# Patient Record
Sex: Female | Born: 1980 | Race: White | Hispanic: Yes | Marital: Married | Smoking: Never smoker
Health system: Southern US, Community
[De-identification: ages and names within clinical notes are randomized; demographics above are authoritative.]

## PROBLEM LIST (undated history)

## (undated) DIAGNOSIS — F419 Anxiety disorder, unspecified: Secondary | ICD-10-CM

## (undated) DIAGNOSIS — M1990 Unspecified osteoarthritis, unspecified site: Secondary | ICD-10-CM

## (undated) DIAGNOSIS — E079 Disorder of thyroid, unspecified: Secondary | ICD-10-CM

---

## 2006-07-19 ENCOUNTER — Encounter (HOSPITAL_COMMUNITY): Admission: RE | Admit: 2006-07-19 | Discharge: 2006-08-18 | Payer: Self-pay | Admitting: Endocrinology

## 2013-09-24 ENCOUNTER — Emergency Department (HOSPITAL_COMMUNITY): Payer: Medicaid Other

## 2013-09-24 ENCOUNTER — Encounter (HOSPITAL_COMMUNITY): Payer: Self-pay | Admitting: Emergency Medicine

## 2013-09-24 ENCOUNTER — Inpatient Hospital Stay (HOSPITAL_COMMUNITY)
Admission: EM | Admit: 2013-09-24 | Discharge: 2013-09-25 | DRG: 176 | Disposition: A | Discharge status: Home / Self Care | Payer: Medicaid Other | Attending: Internal Medicine | Admitting: Internal Medicine

## 2013-09-24 DIAGNOSIS — F411 Generalized anxiety disorder: Secondary | ICD-10-CM | POA: Diagnosis present

## 2013-09-24 DIAGNOSIS — E039 Hypothyroidism, unspecified: Secondary | ICD-10-CM | POA: Diagnosis present

## 2013-09-24 DIAGNOSIS — I2699 Other pulmonary embolism without acute cor pulmonale: Secondary | ICD-10-CM | POA: Diagnosis not present

## 2013-09-24 DIAGNOSIS — M129 Arthropathy, unspecified: Secondary | ICD-10-CM | POA: Diagnosis present

## 2013-09-24 DIAGNOSIS — R109 Unspecified abdominal pain: Secondary | ICD-10-CM | POA: Diagnosis present

## 2013-09-24 HISTORY — DX: Disorder of thyroid, unspecified: E07.9

## 2013-09-24 HISTORY — DX: Anxiety disorder, unspecified: F41.9

## 2013-09-24 HISTORY — DX: Unspecified osteoarthritis, unspecified site: M19.90

## 2013-09-24 LAB — CBC WITH DIFFERENTIAL/PLATELET
BASOS ABS: 0 10*3/uL (ref 0.0–0.1)
BASOS PCT: 0 % (ref 0–1)
EOS ABS: 0.3 10*3/uL (ref 0.0–0.7)
Eosinophils Relative: 3 % (ref 0–5)
HCT: 37.9 % (ref 36.0–46.0)
Hemoglobin: 13.5 g/dL (ref 12.0–15.0)
LYMPHS ABS: 2.1 10*3/uL (ref 0.7–4.0)
Lymphocytes Relative: 18 % (ref 12–46)
MCH: 30.5 pg (ref 26.0–34.0)
MCHC: 35.6 g/dL (ref 30.0–36.0)
MCV: 85.6 fL (ref 78.0–100.0)
Monocytes Absolute: 0.7 10*3/uL (ref 0.1–1.0)
Monocytes Relative: 6 % (ref 3–12)
NEUTROS PCT: 73 % (ref 43–77)
Neutro Abs: 8.7 10*3/uL — ABNORMAL HIGH (ref 1.7–7.7)
PLATELETS: 291 10*3/uL (ref 150–400)
RBC: 4.43 MIL/uL (ref 3.87–5.11)
RDW: 12.9 % (ref 11.5–15.5)
WBC: 11.8 10*3/uL — ABNORMAL HIGH (ref 4.0–10.5)

## 2013-09-24 LAB — URINALYSIS, ROUTINE W REFLEX MICROSCOPIC
Bilirubin Urine: NEGATIVE
GLUCOSE, UA: NEGATIVE mg/dL
Ketones, ur: NEGATIVE mg/dL
Leukocytes, UA: NEGATIVE
Nitrite: NEGATIVE
Protein, ur: NEGATIVE mg/dL
SPECIFIC GRAVITY, URINE: 1.015 (ref 1.005–1.030)
UROBILINOGEN UA: 0.2 mg/dL (ref 0.0–1.0)
pH: 6.5 (ref 5.0–8.0)

## 2013-09-24 LAB — BASIC METABOLIC PANEL
ANION GAP: 13 (ref 5–15)
BUN: 9 mg/dL (ref 6–23)
CO2: 25 mEq/L (ref 19–32)
Calcium: 9.1 mg/dL (ref 8.4–10.5)
Chloride: 99 mEq/L (ref 96–112)
Creatinine, Ser: 0.64 mg/dL (ref 0.50–1.10)
Glucose, Bld: 88 mg/dL (ref 70–99)
POTASSIUM: 4.4 meq/L (ref 3.7–5.3)
SODIUM: 137 meq/L (ref 137–147)

## 2013-09-24 LAB — D-DIMER, QUANTITATIVE (NOT AT ARMC): D DIMER QUANT: 1.83 ug{FEU}/mL — AB (ref 0.00–0.48)

## 2013-09-24 LAB — PROTIME-INR
INR: 1.04 (ref 0.00–1.49)
Prothrombin Time: 13.6 seconds (ref 11.6–15.2)

## 2013-09-24 LAB — PREGNANCY, URINE: PREG TEST UR: NEGATIVE

## 2013-09-24 LAB — URINE MICROSCOPIC-ADD ON

## 2013-09-24 MED ORDER — ONDANSETRON HCL 4 MG/2ML IJ SOLN: 4.0000 mg | Freq: Three times a day (TID) | INTRAMUSCULAR | Status: AC | PRN

## 2013-09-24 MED ORDER — OXYCODONE-ACETAMINOPHEN 5-325 MG PO TABS
1.0000 | ORAL_TABLET | Freq: Once | ORAL | Status: AC
  Administered 2013-09-24: 1 via ORAL
  Filled 2013-09-24: qty 1

## 2013-09-24 MED ORDER — ACETAMINOPHEN 650 MG RE SUPP: 650.0000 mg | Freq: Four times a day (QID) | RECTAL | Status: DC | PRN

## 2013-09-24 MED ORDER — ONDANSETRON HCL 4 MG PO TABS: 4.0000 mg | ORAL_TABLET | Freq: Four times a day (QID) | ORAL | Status: DC | PRN

## 2013-09-24 MED ORDER — SODIUM CHLORIDE 0.9 % IJ SOLN
3.0000 mL | Freq: Two times a day (BID) | INTRAMUSCULAR | Status: DC
  Administered 2013-09-24: 3 mL via INTRAVENOUS

## 2013-09-24 MED ORDER — IOHEXOL 350 MG/ML SOLN
100.0000 mL | Freq: Once | INTRAVENOUS | Status: AC | PRN
  Administered 2013-09-24: 100 mL via INTRAVENOUS

## 2013-09-24 MED ORDER — ALUM & MAG HYDROXIDE-SIMETH 200-200-20 MG/5ML PO SUSP: 30.0000 mL | Freq: Four times a day (QID) | ORAL | Status: DC | PRN

## 2013-09-24 MED ORDER — LEVOTHYROXINE SODIUM 75 MCG PO TABS
150.0000 ug | ORAL_TABLET | Freq: Every day | ORAL | Status: DC
Start: 2013-09-25 — End: 2013-09-25
  Administered 2013-09-25: 150 ug via ORAL
  Filled 2013-09-24: qty 2

## 2013-09-24 MED ORDER — OXYCODONE HCL 5 MG PO TABS
5.0000 mg | ORAL_TABLET | ORAL | Status: DC | PRN
  Administered 2013-09-24: 5 mg via ORAL
  Filled 2013-09-24: qty 1

## 2013-09-24 MED ORDER — MORPHINE SULFATE 2 MG/ML IJ SOLN: 2.0000 mg | INTRAMUSCULAR | Status: DC | PRN

## 2013-09-24 MED ORDER — ACETAMINOPHEN 325 MG PO TABS: 650.0000 mg | ORAL_TABLET | Freq: Four times a day (QID) | ORAL | Status: DC | PRN

## 2013-09-24 MED ORDER — ONDANSETRON HCL 4 MG/2ML IJ SOLN: 4.0000 mg | Freq: Four times a day (QID) | INTRAMUSCULAR | Status: DC | PRN

## 2013-09-24 MED ORDER — ENOXAPARIN SODIUM 80 MG/0.8ML ~~LOC~~ SOLN
70.0000 mg | Freq: Two times a day (BID) | SUBCUTANEOUS | Status: DC
  Administered 2013-09-25: 70 mg via SUBCUTANEOUS
  Filled 2013-09-24: qty 0.8

## 2013-09-24 MED ORDER — ESCITALOPRAM OXALATE 10 MG PO TABS
10.0000 mg | ORAL_TABLET | Freq: Every day | ORAL | Status: DC
  Administered 2013-09-25: 10 mg via ORAL
  Filled 2013-09-24: qty 1

## 2013-09-24 MED ORDER — ENOXAPARIN SODIUM 80 MG/0.8ML ~~LOC~~ SOLN
70.0000 mg | Freq: Once | SUBCUTANEOUS | Status: AC
  Administered 2013-09-24: 70 mg via SUBCUTANEOUS
  Filled 2013-09-24: qty 0.8

## 2013-09-24 NOTE — ED Notes (Signed)
Back pain- cramping. Worse with inspiration and movement per pt.

## 2013-09-24 NOTE — ED Provider Notes (Signed)
CSN: [id number]     Arrival date & time 09/24/13  1111 History   First MD Initiated Contact with Patient 09/24/13 1133     Chief Complaint  Patient presents with  . Back Pain     (Consider location/radiation/quality/duration/timing/severity/associated sxs/prior Treatment) The history is provided by the patient and a relative.   Adrienne Dennis is a 33 y.o. female who is normally a very active, healthy person who developed rather sudden onset of left mid back and flank pain when sitting on her couch yesterday evening.  She reports pain is worsened with deep inspiration and with movement.  She denies fever or chills, cough, chest pain, nausea, vomiting, dysuria and does not have a history of prior back pain or kidney stones. She also denies recent long periods of inactivity and denies lower extremity pain, swelling or trauma.  There is no radiation of pain which has been constant.  She has taken no medications prior to arrival.      Past Medical History  Diagnosis Date  . Thyroid disease   . Anxiety   . Arthritis    History reviewed. No pertinent past surgical history. History reviewed. No pertinent family history. History  Substance Use Topics  . Smoking status: Never Smoker   . Smokeless tobacco: Not on file  . Alcohol Use: No   OB History   Grav Para Term Preterm Abortions TAB SAB Ect Mult Living                 Review of Systems  Constitutional: Negative for fever and chills.  HENT: Negative for congestion and sore throat.   Eyes: Negative.   Respiratory: Negative for cough, chest tightness, shortness of breath, wheezing and stridor.   Cardiovascular: Negative for chest pain.  Gastrointestinal: Negative for nausea, vomiting and abdominal pain.  Genitourinary: Positive for flank pain. Negative for dysuria and hematuria.  Musculoskeletal: Negative for arthralgias, joint swelling and neck pain.  Skin: Negative.  Negative for rash and wound.  Neurological: Negative for  dizziness, weakness, light-headedness, numbness and headaches.  Psychiatric/Behavioral: Negative.       Allergies  Review of patient's allergies indicates no known allergies.  Home Medications   Prior to Admission medications   Medication Sig Start Date End Date Taking? Authorizing Provider  desogestrel-ethinyl estradiol (VELIVET,CAZIANT,CESIA,CYCLESSA) 0.1/0.125/0.15 -0.025 MG tablet Take 1 tablet by mouth daily.   Yes Historical Provider, MD  escitalopram (LEXAPRO) 10 MG tablet Take 10 mg by mouth daily.   Yes Historical Provider, MD  levothyroxine (SYNTHROID, LEVOTHROID) 150 MCG tablet Take 150 mcg by mouth daily before breakfast.   Yes Historical Provider, MD   BP 113/81  Pulse 106  Temp(Src) 99 F (37.2 C) (Oral)  Resp 20  Ht 5' (1.524 m)  Wt 150 lb (68.04 kg)  BMI 29.30 kg/m2  SpO2 97%  LMP 09/14/2013 Physical Exam  Nursing note and vitals reviewed. Constitutional: She appears well-developed and well-nourished.  HENT:  Head: Normocephalic and atraumatic.  Eyes: Conjunctivae are normal.  Neck: Normal range of motion.  Cardiovascular: Normal rate, regular rhythm, normal heart sounds and intact distal pulses.   Pulmonary/Chest: Effort normal and breath sounds normal. No respiratory distress. She has no wheezes. She exhibits no tenderness.  Unable to reproduce flank pain.  No cva tenderness.  Borderline tachy.  Abdominal: Soft. Bowel sounds are normal. There is no tenderness.  Musculoskeletal: Normal range of motion. She exhibits no edema and no tenderness.  No leg, calf or ankle tenderness.  Neurological: She is alert.  Skin: Skin is warm and dry.  Psychiatric: She has a normal mood and affect.    ED Course  Procedures (including critical care time) Labs Review Labs Reviewed  URINALYSIS, ROUTINE W REFLEX MICROSCOPIC - Abnormal; Notable for the following:    Hgb urine dipstick LARGE (*)    All other components within normal limits  D-DIMER, QUANTITATIVE -  Abnormal; Notable for the following:    D-Dimer, Quant 1.83 (*)    All other components within normal limits  CBC WITH DIFFERENTIAL - Abnormal; Notable for the following:    WBC 11.8 (*)    Neutro Abs 8.7 (*)    All other components within normal limits  PREGNANCY, URINE  URINE MICROSCOPIC-ADD ON  BASIC METABOLIC PANEL  PROTIME-INR  TSH    Imaging Review Dg Chest 2 View  09/24/2013   CLINICAL DATA:  Pleuritic flank pain.  EXAM: CHEST  2 VIEW  COMPARISON:  None.  FINDINGS: There are low lung volumes with patchy bibasilar airspace opacities. No hemidiaphragm obscuration or significant pleural effusion is seen. The heart size and mediastinal contours are normal.  IMPRESSION: Patchy bibasilar airspace opacities, most likely reflecting atelectasis or pneumonia. Correlate clinically.   Electronically Signed   By: Roxy Horseman M.D.   On: 09/24/2013 12:49   Ct Angio Chest Pe W/cm &/or Wo Cm  09/24/2013   CLINICAL DATA:  Chest pain. Shortness of breath. History of anxiety.  EXAM: CT ANGIOGRAPHY CHEST WITH CONTRAST  TECHNIQUE: Multidetector CT imaging of the chest was performed using the standard protocol during bolus administration of intravenous contrast. Multiplanar CT image reconstructions and MIPs were obtained to evaluate the vascular anatomy.  CONTRAST:  OMNIPAQUE IOHEXOL 350 MG/ML SOLN  COMPARISON:  Chest radiograph of earlier today.  No prior CT.  FINDINGS: Lungs/Pleura:  Minimal motion degradation.  Bibasilar subsegmental atelectasis, greater on the left.  Trace left pleural fluid.  Heart/Mediastinum: The quality of this examination for evaluation of pulmonary embolism is moderate. The bolus is minimally suboptimally timed, with contrast centered in the SVC. Relatively low volume but definite pulmonary emboli to segmental to subsegmental left lower lobe. Example image 109/series 10 and coronal image 73 of series 602. No evidence of right heart strain.  Normal aortic caliber without  dissection. Mild cardiomegaly. No mediastinal or hilar adenopathy.  Upper Abdomen: Normal imaged portions of the liver, spleen, stomach.  Bones/Musculoskeletal:  No acute osseous abnormality.  Review of the MIP images confirms the above findings.  IMPRESSION: 1. Small volume but definite left lower lobe pulmonary emboli. No evidence of right heart strain. 2. Small left pleural effusion with left greater than right bibasilar atelectasis. 3. Mild cardiomegaly. These results were called by telephone at the time of interpretation on 09/24/2013 at 2:53 pm to Ginger Pruitt, r.n. who verbally acknowledged these results.   Electronically Signed   By: Jeronimo Greaves M.D.   On: 09/24/2013 14:54     EKG Interpretation None      MDM   Final diagnoses:  Pulmonary embolism    Patients labs and/or radiological studies were viewed and considered during the medical decision making and disposition process. Pt with Pulmonary embolism.  Discussed with Dr. Vanessa Barbara with [facility].  Will admit.  Temp admission orders placed.    Burgess Amor, PA-C 09/24/13 1724  Burgess Amor, PA-C 09/24/13 1724

## 2013-09-24 NOTE — Progress Notes (Signed)
ANTICOAGULATION CONSULT NOTE - Initial Consult  Pharmacy Consult for Lovenox Indication: pulmonary embolus  No Known Allergies  Patient Measurements: Height: 5' (152.4 cm) Weight: 150 lb (68.04 kg) IBW/kg (Calculated) : 45.5 Heparin Dosing Weight: 68 kg  Vital Signs: Temp: 99 F (37.2 C) (08/12 1617) Temp src: Oral (08/12 1617) BP: 113/81 mmHg (08/12 1617) Pulse Rate: 106 (08/12 1617)  Labs:  Recent Labs  09/24/13 1305  HGB 13.5  HCT 37.9  PLT 291  CREATININE 0.64    Estimated Creatinine Clearance: 85.3 ml/min (by C-G formula based on Cr of 0.64).   Medical History: Past Medical History  Diagnosis Date  . Thyroid disease   . Anxiety   . Arthritis     Medications:  Scheduled:  . enoxaparin (LOVENOX) injection  70 mg Subcutaneous Once  . [START ON 09/25/2013] enoxaparin (LOVENOX) injection  70 mg Subcutaneous Q12H  . escitalopram  10 mg Oral Daily  . [START ON 09/25/2013] levothyroxine  150 mcg Oral QAC breakfast  . sodium chloride  3 mL Intravenous Q12H    Assessment: 33 y.o. Female, presenting with complaints of left-sided back pain, some shortness of breath Small left lower lobe pulmonary emboli  Oral contraceptive use may be only risk factor CrCl 85.3 ml/min   Goal of Therapy:  Anti-Xa level 0.6-1 units/ml 4hrs after LMWH dose given Monitor platelets by anticoagulation protocol: Yes   Plan:  Lovenox 70 mg (1 mg/kg) SQ every 12 hours Monitor CBC/platelets Monitor renal function Labs per protocol  Raquel JamesPittman, Kaden Daughdrill Bennett 09/24/2013,4:28 PM

## 2013-09-24 NOTE — H&P (Signed)
[facility] History and Physical  Siona Coulston ZOX:[id number] DOB: 1980/03/29 DOA: 09/24/2013  Referring physician:  PCP: No PCP Per Patient   Chief Complaint: Back pain  HPI: Adrienne Dennis is a 33 y.o. female with a past medical history of hypothyroidism, anxiety, presenting to the emergency room with complaints of left-sided back pain. She states that symptoms started approximately at 11:30 yesterday evening occurring at home. She noticed that her pain was precipitated by deep inspiration and cough, associated with some shortness of breath. She denies precordial chest pain, fevers, chills, palpitations, dizziness, lightheadedness, syncope, presyncope. In the emergency room she was worked up with a CT scan of lungs with contrast which identified a small left lower lobe pulmonary emboli. There is no evidence of right heart strain per radiology. She was also found to have small pleural effusions with left greater than right bibasilar atelectasis. She denies recent travel, tobacco abuse, or prolonged periods of immobility.                                                                               Review of Systems:  Constitutional:  No weight loss, night sweats, Fevers, chills, fatigue.  HEENT:  No headaches, Difficulty swallowing,Tooth/dental problems,Sore throat,  No sneezing, itching, ear ache, nasal congestion, post nasal drip,  Cardio-vascular:  No chest pain, Orthopnea, PND, swelling in lower extremities, anasarca, dizziness, palpitations  GI:  No heartburn, indigestion, abdominal pain, nausea, vomiting, diarrhea, change in bowel habits, loss of appetite  Resp:  No shortness of breath with exertion or at rest. No excess mucus, no productive cough, No non-productive cough, No coughing up of blood.No change in color of mucus.No wheezing.No chest wall deformity  Skin:  no rash or lesions.  GU:  no dysuria, change in color of urine, no urgency or frequency. No flank pain.    Musculoskeletal:  No joint pain or swelling. No decreased range of motion. Positive for back pain with deep inspiration.  Psych:  No change in mood or affect. No depression or anxiety. No memory loss.   Past Medical History  Diagnosis Date  . Thyroid disease   . Anxiety   . Arthritis    History reviewed. No pertinent past surgical history. Social History:  has no tobacco, alcohol, and drug history on file.  No Known Allergies  No family history on file.   Prior to Admission medications   Medication Sig Start Date End Date Taking? Authorizing Provider  desogestrel-ethinyl estradiol (VELIVET,CAZIANT,CESIA,CYCLESSA) 0.1/0.125/0.15 -0.025 MG tablet Take 1 tablet by mouth daily.   Yes Historical Provider, MD  escitalopram (LEXAPRO) 10 MG tablet Take 10 mg by mouth daily.   Yes Historical Provider, MD  levothyroxine (SYNTHROID, LEVOTHROID) 150 MCG tablet Take 150 mcg by mouth daily before breakfast.   Yes Historical Provider, MD   Physical Exam: Filed Vitals:   09/24/13 1124 09/24/13 1357  BP: 129/79 123/73  Pulse: 102 102  Temp: 98.3 F (36.8 C)   TempSrc: Oral   Resp: 17 20  Weight: 68.04 kg (150 lb)   SpO2: 100% 96%    Wt Readings from Last 3 Encounters:  09/24/13 68.04 kg (150 lb)    General:  Appears calm and  comfortable, no acute distress Eyes: PERRL, normal lids, irises & conjunctiva ENT: grossly normal hearing, lips & tongue Neck: no LAD, masses or thyromegaly Cardiovascular: RRR, no m/r/g. No LE edema. Telemetry: SR, no arrhythmias  Respiratory: CTA bilaterally, no w/r/r. Normal respiratory effort. Abdomen: soft, ntnd Skin: no rash or induration seen on limited exam Musculoskeletal: grossly normal tone BUE/BLE Psychiatric: grossly normal mood and affect, speech fluent and appropriate Neurologic: grossly non-focal.          Labs on Admission:  Basic Metabolic Panel:  Recent Labs Lab 09/24/13 1305  NA 137  K 4.4  CL 99  CO2 25  GLUCOSE 88  BUN 9   CREATININE 0.64  CALCIUM 9.1   Liver Function Tests: No results found for this basename: AST, ALT, ALKPHOS, BILITOT, PROT, ALBUMIN,  in the last 168 hours No results found for this basename: LIPASE, AMYLASE,  in the last 168 hours No results found for this basename: AMMONIA,  in the last 168 hours CBC:  Recent Labs Lab 09/24/13 1305  WBC 11.8*  NEUTROABS 8.7*  HGB 13.5  HCT 37.9  MCV 85.6  PLT 291   Cardiac Enzymes: No results found for this basename: CKTOTAL, CKMB, CKMBINDEX, TROPONINI,  in the last 168 hours  BNP (last 3 results) No results found for this basename: PROBNP,  in the last 8760 hours CBG: No results found for this basename: GLUCAP,  in the last 168 hours  Radiological Exams on Admission: Dg Chest 2 View  09/24/2013   CLINICAL DATA:  Pleuritic flank pain.  EXAM: CHEST  2 VIEW  COMPARISON:  None.  FINDINGS: There are low lung volumes with patchy bibasilar airspace opacities. No hemidiaphragm obscuration or significant pleural effusion is seen. The heart size and mediastinal contours are normal.  IMPRESSION: Patchy bibasilar airspace opacities, most likely reflecting atelectasis or pneumonia. Correlate clinically.   Electronically Signed   By: Roxy Horseman M.D.   On: 09/24/2013 12:49   Ct Angio Chest Pe W/cm &/or Wo Cm  09/24/2013   CLINICAL DATA:  Chest pain. Shortness of breath. History of anxiety.  EXAM: CT ANGIOGRAPHY CHEST WITH CONTRAST  TECHNIQUE: Multidetector CT imaging of the chest was performed using the standard protocol during bolus administration of intravenous contrast. Multiplanar CT image reconstructions and MIPs were obtained to evaluate the vascular anatomy.  CONTRAST:  OMNIPAQUE IOHEXOL 350 MG/ML SOLN  COMPARISON:  Chest radiograph of earlier today.  No prior CT.  FINDINGS: Lungs/Pleura:  Minimal motion degradation.  Bibasilar subsegmental atelectasis, greater on the left.  Trace left pleural fluid.  Heart/Mediastinum: The quality of this  examination for evaluation of pulmonary embolism is moderate. The bolus is minimally suboptimally timed, with contrast centered in the SVC. Relatively low volume but definite pulmonary emboli to segmental to subsegmental left lower lobe. Example image 109/series 10 and coronal image 73 of series 602. No evidence of right heart strain.  Normal aortic caliber without dissection. Mild cardiomegaly. No mediastinal or hilar adenopathy.  Upper Abdomen: Normal imaged portions of the liver, spleen, stomach.  Bones/Musculoskeletal:  No acute osseous abnormality.  Review of the MIP images confirms the above findings.  IMPRESSION: 1. Small volume but definite left lower lobe pulmonary emboli. No evidence of right heart strain. 2. Small left pleural effusion with left greater than right bibasilar atelectasis. 3. Mild cardiomegaly. These results were called by telephone at the time of interpretation on 09/24/2013 at 2:53 pm to Ginger Pruitt, r.n. who verbally acknowledged these results.  Electronically Signed   By: Jeronimo GreavesKyle  Talbot M.D.   On: 09/24/2013 14:54    EKG:   Assessment/Plan Principal Problem:   Pulmonary embolism Active Problems:   Hypothyroid   PE (pulmonary embolism)   1. Pulmonary embolism. Patient presenting with complaints of left-sided back pain precipitated by deep inspiration. On CT scan of lungs with IV contrast was found to have a small left-sided pulmonary embolus. She denies history of prolonged immobility or recent travel, does report oral contraceptive use which may be her only risk factor. Will place her in overnight observation on telemetry, start anticoagulation with Lovenox 1 mg per kilogram body weight, pharmacy consultation for dosing. Anticipate discharge in a.m. if she remains stable. Consult social work for prescription assistance.  2. Hypothyroidism. Will check a TSH, meanwhile continue her home regimen of Synthroid 150 mg by mouth daily 3. Nutrition. Regular diet 4. DVT  prophylaxis. Patient fully anticoagulated    Code Status: Full code Family Communication:  Disposition Plan: Will place in 24-hour observation, do not anticipate requiring greater than 2 nights hospitalization  Time spent: 55 min  Jeralyn BennettZAMORA, Oneita Allmon [facility] Pager [phone]  **Disclaimer: This note may have been dictated with voice recognition software. Similar sounding words can inadvertently be transcribed and this note may contain transcription errors which may not have been corrected upon publication of note.**

## 2013-09-25 LAB — BASIC METABOLIC PANEL
Anion gap: 12 (ref 5–15)
BUN: 6 mg/dL (ref 6–23)
CHLORIDE: 101 meq/L (ref 96–112)
CO2: 26 meq/L (ref 19–32)
CREATININE: 0.66 mg/dL (ref 0.50–1.10)
Calcium: 9 mg/dL (ref 8.4–10.5)
GFR calc Af Amer: >90 mL/min (ref >90)
GFR calc non Af Amer: >90 mL/min (ref >90)
GLUCOSE: 106 mg/dL — AB (ref 70–99)
Potassium: 3.6 mEq/L — ABNORMAL LOW (ref 3.7–5.3)
Sodium: 139 mEq/L (ref 137–147)

## 2013-09-25 LAB — CBC
HCT: 38.2 % (ref 36.0–46.0)
HEMOGLOBIN: 13.3 g/dL (ref 12.0–15.0)
MCH: 30.4 pg (ref 26.0–34.0)
MCHC: 34.8 g/dL (ref 30.0–36.0)
MCV: 87.2 fL (ref 78.0–100.0)
Platelets: 279 10*3/uL (ref 150–400)
RBC: 4.38 MIL/uL (ref 3.87–5.11)
RDW: 13 % (ref 11.5–15.5)
WBC: 8.2 10*3/uL (ref 4.0–10.5)

## 2013-09-25 LAB — TSH: TSH: 0.214 u[IU]/mL — ABNORMAL LOW (ref 0.350–4.500)

## 2013-09-25 MED ORDER — RIVAROXABAN 20 MG PO TABS: 20.0000 mg | ORAL_TABLET | Freq: Every day | ORAL | Status: DC

## 2013-09-25 MED ORDER — RIVAROXABAN 15 MG PO TABS
15.0000 mg | ORAL_TABLET | Freq: Two times a day (BID) | ORAL | Status: DC
  Administered 2013-09-25: 15 mg via ORAL
  Filled 2013-09-25: qty 1

## 2013-09-25 MED ORDER — RIVAROXABAN 15 MG PO TABS: ORAL_TABLET | ORAL | Status: AC

## 2013-09-25 MED ORDER — RIVAROXABAN 15 MG PO TABS: 15.0000 mg | ORAL_TABLET | Freq: Two times a day (BID) | ORAL | Status: DC

## 2013-09-25 NOTE — Discharge Summary (Addendum)
Physician Discharge Summary  Adrienne Dennis Robak WUJ:[id number] DOB: 11-14-1980 DOA: 09/24/2013  PCP: No PCP Per Patient  Admit date: 09/24/2013 Discharge date: 09/25/2013  Time spent: 35 minutes  Recommendations for Outpatient Follow-up:  1. Patient discharged on Xarelto therapy for newly diagnosed PE 2. Please followup on repeat TSH and 4-6 weeks  Discharge Diagnoses:  Principal Problem:   Pulmonary embolism Active Problems:   Hypothyroid   PE (pulmonary embolism)   Discharge Condition: Stable/improved  Diet recommendation: Regular diet  Filed Weights   09/24/13 1124 09/24/13 1617  Weight: 68.04 kg (150 lb) 68.04 kg (150 lb)    History of present illness:  Adrienne Dennis Bilyeu is a 33 y.o. female with a past medical history of hypothyroidism, anxiety, presenting to the emergency room with complaints of left-sided back pain. She states that symptoms started approximately at 11:30 yesterday evening occurring at home. She noticed that her pain was precipitated by deep inspiration and cough, associated with some shortness of breath. She denies precordial chest pain, fevers, chills, palpitations, dizziness, lightheadedness, syncope, presyncope. In the emergency room she was worked up with a CT scan of lungs with contrast which identified a small left lower lobe pulmonary emboli. There is no evidence of right heart strain per radiology. She was also found to have small pleural effusions with left greater than right bibasilar atelectasis. She denies recent travel, tobacco abuse, or prolonged periods of immobility.    Hospital Course:  Patient is a pleasant 33 year old female with a past medical history of hypothyroidism, presented to the emergency room on 09/24/2013 with complaints of left-sided back pain precipitated by deep inspiration. Symptoms have started evening prior to presentation. Initial workup included a CT scan of lungs without contrast which identified a small left lower lobe pulmonary  embolus. She was started on anticoagulation with subcutaneous Lovenox. Patient was admitted to telemetry where she was observed overnight. She remained hemodynamically stable. I suspect that's oral contraceptive pills may have precipitated the development of DVT. She denied history of recent travel or prolonged immobilization. On the following morning she reported feeling well with improvement to back pain. Other issues addressed during this hospitalization include hypothyroidism as a TSH was checked, back depressed at 0.214. Patient had reported having a decrease in her Synthroid dose from 175 mcg to 150 mcg by mouth daily by her primary care physician. Social work was consulted for prescription assistance. Given clinical stability she was discharged in stable condition on 09/25/2013 to her home.   Discharge Exam: Filed Vitals:   09/25/13 0534  BP: 110/71  Pulse: 94  Temp: 98.4 F (36.9 C)  Resp: 18    General: Patient is in no acute distress she is awake and alert oriented x3 Cardiovascular: Regular rate rhythm normal S1-S2 no murmurs rubs or gallop Respiratory: Regular rate rhythm normal S1-S2 no murmurs rubs gallops Abdomen: Soft nontender nondistended positive bowel sounds  Discharge Instructions You were cared for by a hospitalist during your hospital stay. If you have any questions about your discharge medications or the care you received while you were in the hospital after you are discharged, you can call the unit and asked to speak with the hospitalist on call if the hospitalist that took care of you is not available. Once you are discharged, your primary care physician will handle any further medical issues. Please note that NO REFILLS for any discharge medications will be authorized once you are discharged, as it is imperative that you return to your primary care physician (or  establish a relationship with a primary care physician if you do not have one) for your aftercare needs so  that they can reassess your need for medications and monitor your lab values.      Discharge Instructions   Call MD for:  difficulty breathing, headache or visual disturbances    Complete by:  As directed      Call MD for:  extreme fatigue    Complete by:  As directed      Call MD for:  hives    Complete by:  As directed      Call MD for:  persistant dizziness or light-headedness    Complete by:  As directed      Call MD for:  redness, tenderness, or signs of infection (pain, swelling, redness, odor or green/yellow discharge around incision site)    Complete by:  As directed      Call MD for:  severe uncontrolled pain    Complete by:  As directed      Call MD for:  temperature >100.4    Complete by:  As directed      Diet - low sodium heart healthy    Complete by:  As directed      Increase activity slowly    Complete by:  As directed             Medication List    STOP taking these medications       desogestrel-ethinyl estradiol 0.1/0.125/0.15 -0.025 MG tablet  Commonly known as:  VELIVET,CAZIANT,CESIA,CYCLESSA      TAKE these medications       escitalopram 10 MG tablet  Commonly known as:  LEXAPRO  Take 10 mg by mouth daily.     levothyroxine 150 MCG tablet  Commonly known as:  SYNTHROID, LEVOTHROID  Take 150 mcg by mouth daily before breakfast.     Rivaroxaban 15 MG Tabs tablet  Commonly known as:  XARELTO  Take 15 mg PO BID for 3 weeks, then 20 mg PO q daily thereafter       No Known Allergies Follow-up Information   Follow up with No PCP Per Patient.   Specialty:  General Practice       The results of significant diagnostics from this hospitalization (including imaging, microbiology, ancillary and laboratory) are listed below for reference.    Significant Diagnostic Studies: Dg Chest 2 View  09/24/2013   CLINICAL DATA:  Pleuritic flank pain.  EXAM: CHEST  2 VIEW  COMPARISON:  None.  FINDINGS: There are low lung volumes with patchy bibasilar airspace  opacities. No hemidiaphragm obscuration or significant pleural effusion is seen. The heart size and mediastinal contours are normal.  IMPRESSION: Patchy bibasilar airspace opacities, most likely reflecting atelectasis or pneumonia. Correlate clinically.   Electronically Signed   By: Roxy Horseman M.D.   On: 09/24/2013 12:49   Ct Angio Chest Pe W/cm &/or Wo Cm  09/24/2013   CLINICAL DATA:  Chest pain. Shortness of breath. History of anxiety.  EXAM: CT ANGIOGRAPHY CHEST WITH CONTRAST  TECHNIQUE: Multidetector CT imaging of the chest was performed using the standard protocol during bolus administration of intravenous contrast. Multiplanar CT image reconstructions and MIPs were obtained to evaluate the vascular anatomy.  CONTRAST:  OMNIPAQUE IOHEXOL 350 MG/ML SOLN  COMPARISON:  Chest radiograph of earlier today.  No prior CT.  FINDINGS: Lungs/Pleura:  Minimal motion degradation.  Bibasilar subsegmental atelectasis, greater on the left.  Trace left pleural fluid.  Heart/Mediastinum: The  quality of this examination for evaluation of pulmonary embolism is moderate. The bolus is minimally suboptimally timed, with contrast centered in the SVC. Relatively low volume but definite pulmonary emboli to segmental to subsegmental left lower lobe. Example image 109/series 10 and coronal image 73 of series 602. No evidence of right heart strain.  Normal aortic caliber without dissection. Mild cardiomegaly. No mediastinal or hilar adenopathy.  Upper Abdomen: Normal imaged portions of the liver, spleen, stomach.  Bones/Musculoskeletal:  No acute osseous abnormality.  Review of the MIP images confirms the above findings.  IMPRESSION: 1. Small volume but definite left lower lobe pulmonary emboli. No evidence of right heart strain. 2. Small left pleural effusion with left greater than right bibasilar atelectasis. 3. Mild cardiomegaly. These results were called by telephone at the time of interpretation on 09/24/2013 at 2:53 pm to  Ginger Pruitt, r.n. who verbally acknowledged these results.   Electronically Signed   By: Jeronimo Greaves M.D.   On: 09/24/2013 14:54    Microbiology: No results found for this or any previous visit (from the past 240 hour(s)).   Labs: Basic Metabolic Panel:  Recent Labs Lab 09/24/13 1305 09/25/13 0546  NA 137 139  K 4.4 3.6*  CL 99 101  CO2 25 26  GLUCOSE 88 106*  BUN 9 6  CREATININE 0.64 0.66  CALCIUM 9.1 9.0   Liver Function Tests: No results found for this basename: AST, ALT, ALKPHOS, BILITOT, PROT, ALBUMIN,  in the last 168 hours No results found for this basename: LIPASE, AMYLASE,  in the last 168 hours No results found for this basename: AMMONIA,  in the last 168 hours CBC:  Recent Labs Lab 09/24/13 1305 09/25/13 0546  WBC 11.8* 8.2  NEUTROABS 8.7*  --   HGB 13.5 13.3  HCT 37.9 38.2  MCV 85.6 87.2  PLT 291 279   Cardiac Enzymes: No results found for this basename: CKTOTAL, CKMB, CKMBINDEX, TROPONINI,  in the last 168 hours BNP: BNP (last 3 results) No results found for this basename: PROBNP,  in the last 8760 hours CBG: No results found for this basename: GLUCAP,  in the last 168 hours     Signed:  Jeralyn Bennett  [facility] 09/25/2013, 12:25 PM

## 2013-09-25 NOTE — Progress Notes (Signed)
ANTICOAGULATION CONSULT NOTE  Pharmacy Consult for Xarelto Indication: pulmonary embolus  No Known Allergies  Patient Measurements: Height: 5' (152.4 cm) Weight: 150 lb (68.04 kg) IBW/kg (Calculated) : 45.5  Vital Signs: Temp: 98.4 F (36.9 C) (08/13 0534) Temp src: Oral (08/13 0534) BP: 110/71 mmHg (08/13 0534) Pulse Rate: 94 (08/13 0534)  Labs:  Recent Labs  09/24/13 1305 09/25/13 0546  HGB 13.5 13.3  HCT 37.9 38.2  PLT 291 279  LABPROT 13.6  --   INR 1.04  --   CREATININE 0.64 0.66    Estimated Creatinine Clearance: 85.3 ml/min (by C-G formula based on Cr of 0.66).   Medical History: Past Medical History  Diagnosis Date  . Thyroid disease   . Anxiety   . Arthritis     Medications:  Scheduled:  . escitalopram  10 mg Oral Daily  . levothyroxine  150 mcg Oral QAC breakfast  . rivaroxaban  15 mg Oral BID WC   Followed by  . [START ON 10/16/2013] rivaroxaban  20 mg Oral Q supper  . sodium chloride  3 mL Intravenous Q12H    Assessment: 33 yo Female with + PE. Oral contraceptive use may be only risk factor. CBC reviewed.  No bleeding noted.  Renal function is at patient's baseline.   Goal of Therapy:  Monitor platelets by anticoagulation protocol: Yes   Plan:  Xarelto 15mg  po bid x 21 days followed by 30mg  po daily with FOOD Provide Xarelto education  Consider d/c oral contraceptives  Elson ClanLilliston, Reign Bartnick Michelle 09/25/2013,10:13 AM

## 2013-09-25 NOTE — Care Management Note (Signed)
    Page 1 of 1   09/25/2013     3:04:24 PM CARE MANAGEMENT NOTE 09/25/2013  Patient:  Adrienne Dennis,Adrienne Dennis   Account Number:  [id number]  Date Initiated:  09/25/2013  Documentation initiated by:  Kathyrn SheriffHILDRESS,JESSICA  Subjective/Objective Assessment:   Patient admitted with PE. patient from home with self care. Patient independent with ADL's and has no DME, HH or med needs.     Action/Plan:   pt plans to discharge home today after recieving first dose of xarelto. [facility] [place]. Patient has no CM needs at this time.   Anticipated DC Date:  09/25/2013   Anticipated DC Plan:  HOME/SELF CARE         Choice offered to / List presented to:             Status of service:  Completed, signed off Medicare Important Message given?   (If response is "NO", the following Medicare IM given date fields will be blank) Date Medicare IM given:   Medicare IM given by:   Date Additional Medicare IM given:   Additional Medicare IM given by:    Discharge Disposition:  HOME/SELF CARE  Per UR Regulation:    If discussed at Long Length of Stay Meetings, dates discussed:    Comments:  09/25/2013 1500 Kathyrn SheriffJessica Childress, RN, MSN, [facility]

## 2013-09-25 NOTE — ED Provider Notes (Signed)
Medical screening examination/treatment/procedure(s) were performed by non-physician practitioner and as supervising physician I was immediately available for consultation/collaboration.   EKG Interpretation None        Nilah Belcourt L Berania Peedin, MD 09/25/13 1930 

## 2013-09-26 LAB — T4, FREE: Free T4: 1.48 ng/dL (ref 0.80–1.80)

## 2015-06-03 IMAGING — CT CT ANGIO CHEST
1 of 6 series · 5 of 36 positions shown · IV contrast (Omnipaque 300)
Comparison: Chest radiograph of earlier today.  No prior CT.

CLINICAL DATA: Chest pain. Shortness of breath. History of anxiety.

EXAM:
CT ANGIOGRAPHY CHEST WITH CONTRAST
TECHNIQUE: Multidetector CT imaging of the chest was performed using the
standard protocol during bolus administration of intravenous
contrast. Multiplanar CT image reconstructions and MIPs were
obtained to evaluate the vascular anatomy.
CONTRAST:  100mL OMNIPAQUE IOHEXOL 350 MG/ML SOLN

[Series 6: pe 3.0 b40f · axial · 0.56mm/px · z∈[-146,-42]mm · 5 of 53 slices shown]
[im 9/53  lung]
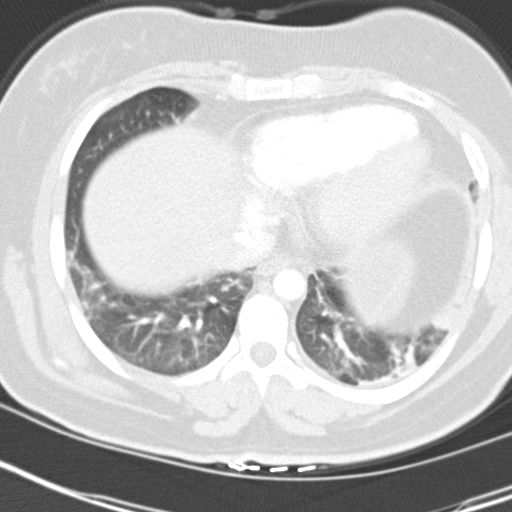
[im 18/53  mediastinal]
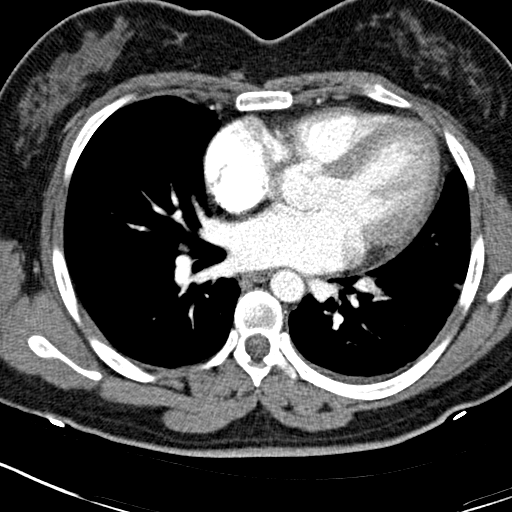
[im 27/53  lung]
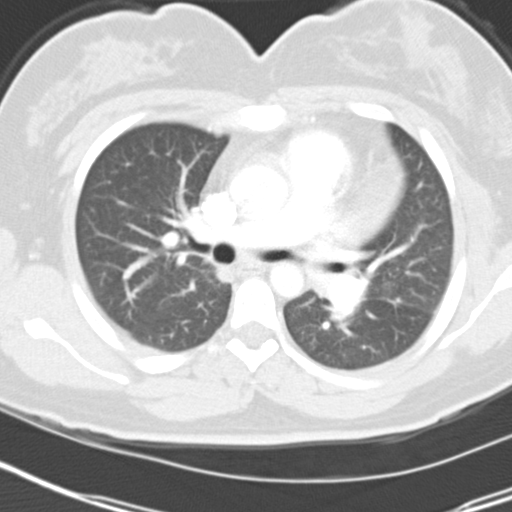
[im 35/53  mediastinal]
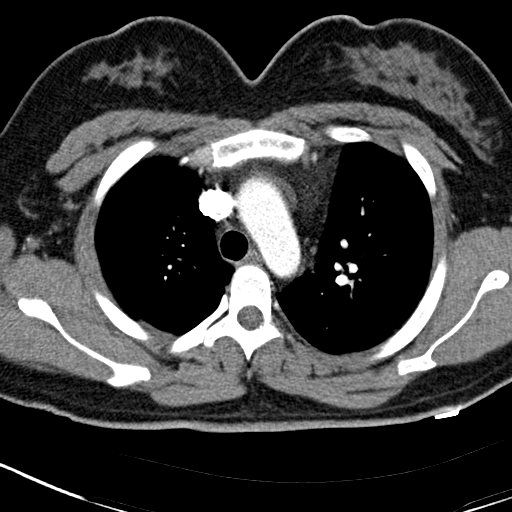
[im 44/53  lung]
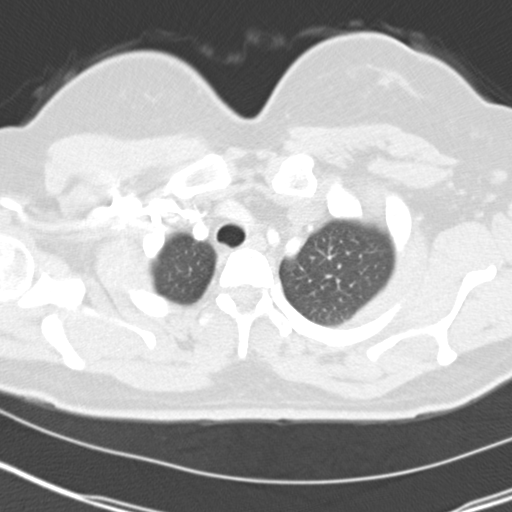

[5 of 36 positions shown; findings below may reference images not displayed]

FINDINGS: Lungs/Pleura:  Minimal motion degradation.

Bibasilar subsegmental atelectasis, greater on the left.

Trace left pleural fluid.

Heart/Mediastinum: The quality of this examination for evaluation of
pulmonary embolism is moderate. The bolus is minimally suboptimally
timed, with contrast centered in the SVC. Relatively low volume but
definite pulmonary emboli to segmental to subsegmental left lower
lobe. Example image 109/series 10 and coronal image 73 of series
602. No evidence of right heart strain.

Normal aortic caliber without dissection. Mild cardiomegaly. No
mediastinal or hilar adenopathy.

Upper Abdomen: Normal imaged portions of the liver, spleen, stomach.

Bones/Musculoskeletal:  No acute osseous abnormality.

Review of the MIP images confirms the above findings.
IMPRESSION: 1. Small volume but definite left lower lobe pulmonary emboli. No
evidence of right heart strain.
2. Small left pleural effusion with left greater than right
bibasilar atelectasis.
3. Mild cardiomegaly.
These results were called by telephone at the time of interpretation
on 09/24/2013 at [DATE] to [place], Pok Su who verbally
acknowledged these results.

## 2015-08-06 DIAGNOSIS — Z6835 Body mass index (BMI) 35.0-35.9, adult: Secondary | ICD-10-CM | POA: Diagnosis not present

## 2015-08-06 DIAGNOSIS — E039 Hypothyroidism, unspecified: Secondary | ICD-10-CM | POA: Diagnosis not present

## 2015-10-08 DIAGNOSIS — H5213 Myopia, bilateral: Secondary | ICD-10-CM | POA: Diagnosis not present

## 2015-11-02 DIAGNOSIS — M62830 Muscle spasm of back: Secondary | ICD-10-CM | POA: Diagnosis not present

## 2015-11-02 DIAGNOSIS — Z1389 Encounter for screening for other disorder: Secondary | ICD-10-CM | POA: Diagnosis not present

## 2015-11-02 DIAGNOSIS — Z86711 Personal history of pulmonary embolism: Secondary | ICD-10-CM | POA: Diagnosis not present

## 2015-11-02 DIAGNOSIS — E039 Hypothyroidism, unspecified: Secondary | ICD-10-CM | POA: Diagnosis not present

## 2015-12-13 DIAGNOSIS — Z01419 Encounter for gynecological examination (general) (routine) without abnormal findings: Secondary | ICD-10-CM | POA: Diagnosis not present

## 2015-12-13 DIAGNOSIS — E039 Hypothyroidism, unspecified: Secondary | ICD-10-CM | POA: Diagnosis not present

## 2016-01-04 DIAGNOSIS — E039 Hypothyroidism, unspecified: Secondary | ICD-10-CM | POA: Diagnosis not present

## 2016-01-04 DIAGNOSIS — Z86711 Personal history of pulmonary embolism: Secondary | ICD-10-CM | POA: Diagnosis not present

## 2016-01-04 DIAGNOSIS — R5383 Other fatigue: Secondary | ICD-10-CM | POA: Diagnosis not present

## 2016-01-04 DIAGNOSIS — F064 Anxiety disorder due to known physiological condition: Secondary | ICD-10-CM | POA: Diagnosis not present

## 2016-04-07 DIAGNOSIS — R05 Cough: Secondary | ICD-10-CM | POA: Diagnosis not present

## 2016-04-07 DIAGNOSIS — J309 Allergic rhinitis, unspecified: Secondary | ICD-10-CM | POA: Diagnosis not present

## 2016-04-07 DIAGNOSIS — Z6835 Body mass index (BMI) 35.0-35.9, adult: Secondary | ICD-10-CM | POA: Diagnosis not present

## 2016-04-19 DIAGNOSIS — E669 Obesity, unspecified: Secondary | ICD-10-CM | POA: Diagnosis not present

## 2016-04-19 DIAGNOSIS — Z3189 Encounter for other procreative management: Secondary | ICD-10-CM | POA: Diagnosis not present

## 2016-04-19 DIAGNOSIS — E039 Hypothyroidism, unspecified: Secondary | ICD-10-CM | POA: Diagnosis not present

## 2016-04-19 DIAGNOSIS — Z3049 Encounter for surveillance of other contraceptives: Secondary | ICD-10-CM | POA: Diagnosis not present

## 2016-04-28 ENCOUNTER — Telehealth: Payer: Self-pay | Admitting: Obstetrics and Gynecology

## 2016-04-28 NOTE — Telephone Encounter (Signed)
Called the Interpreter service and LVm for patient to call back to to schedule due [facility]. Pt will need an interpreter.

## 2016-05-01 NOTE — Telephone Encounter (Signed)
Spoke with Sister of Pt to tell Pt to call to get her schedule.

## 2016-05-02 NOTE — Telephone Encounter (Signed)
[facility] Irving Burtonmily expressing I have attempt to reach pt to schedule appt. Asking if she would be send an letter to contact our office.

## 2017-03-26 DIAGNOSIS — J309 Allergic rhinitis, unspecified: Secondary | ICD-10-CM | POA: Diagnosis not present

## 2017-03-26 DIAGNOSIS — Z6835 Body mass index (BMI) 35.0-35.9, adult: Secondary | ICD-10-CM | POA: Diagnosis not present

## 2017-04-25 DIAGNOSIS — J0110 Acute frontal sinusitis, unspecified: Secondary | ICD-10-CM | POA: Diagnosis not present

## 2017-04-25 DIAGNOSIS — J309 Allergic rhinitis, unspecified: Secondary | ICD-10-CM | POA: Diagnosis not present

## 2017-04-25 DIAGNOSIS — Z6836 Body mass index (BMI) 36.0-36.9, adult: Secondary | ICD-10-CM | POA: Diagnosis not present

## 2017-10-02 DIAGNOSIS — R5383 Other fatigue: Secondary | ICD-10-CM | POA: Diagnosis not present

## 2017-10-02 DIAGNOSIS — R1013 Epigastric pain: Secondary | ICD-10-CM | POA: Diagnosis not present

## 2017-10-02 DIAGNOSIS — M545 Low back pain: Secondary | ICD-10-CM | POA: Diagnosis not present

## 2017-10-02 DIAGNOSIS — E039 Hypothyroidism, unspecified: Secondary | ICD-10-CM | POA: Diagnosis not present

## 2017-10-02 DIAGNOSIS — Z6836 Body mass index (BMI) 36.0-36.9, adult: Secondary | ICD-10-CM | POA: Diagnosis not present

## 2017-11-08 DIAGNOSIS — Z86711 Personal history of pulmonary embolism: Secondary | ICD-10-CM | POA: Diagnosis not present

## 2017-11-08 DIAGNOSIS — M545 Low back pain: Secondary | ICD-10-CM | POA: Diagnosis not present

## 2017-11-08 DIAGNOSIS — R5383 Other fatigue: Secondary | ICD-10-CM | POA: Diagnosis not present

## 2017-11-08 DIAGNOSIS — E039 Hypothyroidism, unspecified: Secondary | ICD-10-CM | POA: Diagnosis not present

## 2018-03-30 DIAGNOSIS — Z6837 Body mass index (BMI) 37.0-37.9, adult: Secondary | ICD-10-CM | POA: Diagnosis not present

## 2018-03-30 DIAGNOSIS — J309 Allergic rhinitis, unspecified: Secondary | ICD-10-CM | POA: Diagnosis not present

## 2018-04-09 DIAGNOSIS — E669 Obesity, unspecified: Secondary | ICD-10-CM | POA: Diagnosis not present

## 2018-04-09 DIAGNOSIS — K219 Gastro-esophageal reflux disease without esophagitis: Secondary | ICD-10-CM | POA: Diagnosis not present

## 2018-04-09 DIAGNOSIS — Z86718 Personal history of other venous thrombosis and embolism: Secondary | ICD-10-CM | POA: Diagnosis not present

## 2018-04-26 DIAGNOSIS — J0101 Acute recurrent maxillary sinusitis: Secondary | ICD-10-CM | POA: Diagnosis not present

## 2018-04-26 DIAGNOSIS — Z6837 Body mass index (BMI) 37.0-37.9, adult: Secondary | ICD-10-CM | POA: Diagnosis not present

## 2018-07-11 DIAGNOSIS — E039 Hypothyroidism, unspecified: Secondary | ICD-10-CM | POA: Diagnosis not present

## 2018-07-11 DIAGNOSIS — R079 Chest pain, unspecified: Secondary | ICD-10-CM | POA: Diagnosis not present

## 2018-07-11 DIAGNOSIS — E876 Hypokalemia: Secondary | ICD-10-CM | POA: Diagnosis not present

## 2018-07-11 DIAGNOSIS — Z79899 Other long term (current) drug therapy: Secondary | ICD-10-CM | POA: Diagnosis not present

## 2018-08-15 DIAGNOSIS — Z20828 Contact with and (suspected) exposure to other viral communicable diseases: Secondary | ICD-10-CM | POA: Diagnosis not present

## 2018-10-03 DIAGNOSIS — E039 Hypothyroidism, unspecified: Secondary | ICD-10-CM | POA: Diagnosis not present

## 2018-10-03 DIAGNOSIS — Z6833 Body mass index (BMI) 33.0-33.9, adult: Secondary | ICD-10-CM | POA: Diagnosis not present

## 2018-10-03 DIAGNOSIS — F064 Anxiety disorder due to known physiological condition: Secondary | ICD-10-CM | POA: Diagnosis not present

## 2018-11-22 DIAGNOSIS — Z6834 Body mass index (BMI) 34.0-34.9, adult: Secondary | ICD-10-CM | POA: Diagnosis not present

## 2018-11-22 DIAGNOSIS — Z3490 Encounter for supervision of normal pregnancy, unspecified, unspecified trimester: Secondary | ICD-10-CM | POA: Diagnosis not present

## 2018-12-05 DIAGNOSIS — N912 Amenorrhea, unspecified: Secondary | ICD-10-CM | POA: Diagnosis not present

## 2018-12-05 DIAGNOSIS — Z3A01 Less than 8 weeks gestation of pregnancy: Secondary | ICD-10-CM | POA: Diagnosis not present

## 2018-12-05 DIAGNOSIS — Z6833 Body mass index (BMI) 33.0-33.9, adult: Secondary | ICD-10-CM | POA: Diagnosis not present

## 2018-12-05 DIAGNOSIS — Z369 Encounter for antenatal screening, unspecified: Secondary | ICD-10-CM | POA: Diagnosis not present

## 2018-12-05 DIAGNOSIS — O3680X0 Pregnancy with inconclusive fetal viability, not applicable or unspecified: Secondary | ICD-10-CM | POA: Diagnosis not present

## 2018-12-05 DIAGNOSIS — Z3201 Encounter for pregnancy test, result positive: Secondary | ICD-10-CM | POA: Diagnosis not present

## 2018-12-23 DIAGNOSIS — O3680X1 Pregnancy with inconclusive fetal viability, fetus 1: Secondary | ICD-10-CM | POA: Diagnosis not present

## 2018-12-23 DIAGNOSIS — O039 Complete or unspecified spontaneous abortion without complication: Secondary | ICD-10-CM | POA: Diagnosis not present

## 2018-12-23 DIAGNOSIS — O3680X0 Pregnancy with inconclusive fetal viability, not applicable or unspecified: Secondary | ICD-10-CM | POA: Diagnosis not present

## 2018-12-23 DIAGNOSIS — Z6833 Body mass index (BMI) 33.0-33.9, adult: Secondary | ICD-10-CM | POA: Diagnosis not present

## 2018-12-23 DIAGNOSIS — Z3A01 Less than 8 weeks gestation of pregnancy: Secondary | ICD-10-CM | POA: Diagnosis not present

## 2018-12-30 DIAGNOSIS — O039 Complete or unspecified spontaneous abortion without complication: Secondary | ICD-10-CM | POA: Diagnosis not present

## 2018-12-30 DIAGNOSIS — Z6834 Body mass index (BMI) 34.0-34.9, adult: Secondary | ICD-10-CM | POA: Diagnosis not present

## 2019-01-13 DIAGNOSIS — O0332 Renal failure following incomplete spontaneous abortion: Secondary | ICD-10-CM | POA: Diagnosis not present

## 2019-01-13 DIAGNOSIS — R300 Dysuria: Secondary | ICD-10-CM | POA: Diagnosis not present

## 2019-01-13 DIAGNOSIS — Z6833 Body mass index (BMI) 33.0-33.9, adult: Secondary | ICD-10-CM | POA: Diagnosis not present

## 2021-03-05 ENCOUNTER — Encounter (HOSPITAL_COMMUNITY): Payer: Self-pay | Admitting: *Deleted

## 2021-03-05 ENCOUNTER — Emergency Department (HOSPITAL_COMMUNITY)
Admission: EM | Admit: 2021-03-05 | Discharge: 2021-03-05 | Disposition: A | Discharge status: Home / Self Care | Payer: Managed Care, Other (non HMO) | Attending: Emergency Medicine | Admitting: Emergency Medicine

## 2021-03-05 DIAGNOSIS — R519 Headache, unspecified: Secondary | ICD-10-CM | POA: Insufficient documentation

## 2021-03-05 DIAGNOSIS — N390 Urinary tract infection, site not specified: Secondary | ICD-10-CM | POA: Insufficient documentation

## 2021-03-05 DIAGNOSIS — M545 Low back pain, unspecified: Secondary | ICD-10-CM | POA: Diagnosis present

## 2021-03-05 LAB — URINALYSIS, ROUTINE W REFLEX MICROSCOPIC
Bilirubin Urine: NEGATIVE
Glucose, UA: NEGATIVE mg/dL
Ketones, ur: 5 mg/dL — AB
Leukocytes,Ua: NEGATIVE
Nitrite: POSITIVE — AB
Protein, ur: NEGATIVE mg/dL
Specific Gravity, Urine: 1.025 (ref 1.005–1.030)
pH: 6 (ref 5.0–8.0)

## 2021-03-05 LAB — PREGNANCY, URINE: Preg Test, Ur: NEGATIVE

## 2021-03-05 MED ORDER — CEPHALEXIN 500 MG PO CAPS: 500.0000 mg | ORAL_CAPSULE | Freq: Two times a day (BID) | ORAL | 0 refills | Status: AC

## 2021-03-05 NOTE — ED Provider Notes (Signed)
[facility] EMERGENCY DEPARTMENT Provider Note   CSN: [id number] Arrival date & time: 03/05/21  1424     History  Chief Complaint  Patient presents with   Flank Pain    Adrienne Dennis is a 41 y.o. female.  41 year old female with complaint of bilateral low back pain intermittent x1 week, worse when lying supine, no pain with activities.  Associated with cloudy urine with odor at times, also headache.  Denies dysuria or frequency, abdominal pain, nausea, vomiting, fevers or chills.  States she was admitted to the hospital 3 years ago when she had a kidney infection when she was pregnant.      Home Medications Prior to Admission medications   Medication Sig Start Date End Date Taking? Authorizing Provider  cephALEXin (KEFLEX) 500 MG capsule Take 1 capsule (500 mg total) by mouth 2 (two) times daily for 5 days. 03/05/21 03/10/21 Yes Tacy Learn, PA-C  escitalopram (LEXAPRO) 10 MG tablet Take 10 mg by mouth daily.    [provider]  levothyroxine (SYNTHROID, LEVOTHROID) 150 MCG tablet Take 150 mcg by mouth daily before breakfast.    [provider]  Rivaroxaban (XARELTO) 15 MG TABS tablet Take 15 mg PO BID for 3 weeks, then 20 mg PO q daily thereafter 09/25/13   Kelvin Cellar, MD      Allergies    Patient has no known allergies.    Review of Systems   Review of Systems  Constitutional:  Negative for chills and fever.  Respiratory:  Negative for shortness of breath.   Cardiovascular:  Negative for chest pain.  Gastrointestinal:  Negative for abdominal pain, constipation, diarrhea, nausea and vomiting.  Genitourinary:  Negative for dysuria, frequency and pelvic pain.  Musculoskeletal:  Positive for back pain.  Skin:  Negative for rash and wound.  Allergic/Immunologic: Negative for immunocompromised state.  Neurological:  Positive for headaches. Negative for weakness.  Hematological:  Negative for adenopathy.  Psychiatric/Behavioral:  Negative for  confusion.   All other systems reviewed and are negative.  Physical Exam Updated Vital Signs BP (!) 138/94 (BP Location: Right Arm)    Pulse (!) 104    Temp 98.3 F (36.8 C) (Oral)    Resp 16    LMP 02/02/2021    SpO2 97%  Physical Exam Vitals and nursing note reviewed.  Constitutional:      General: She is not in acute distress.    Appearance: She is well-developed. She is not diaphoretic.  HENT:     Head: Normocephalic and atraumatic.     Mouth/Throat:     Mouth: Mucous membranes are moist.  Eyes:     Conjunctiva/sclera: Conjunctivae normal.  Cardiovascular:     Rate and Rhythm: Normal rate and regular rhythm.     Heart sounds: Normal heart sounds.  Pulmonary:     Effort: Pulmonary effort is normal.     Breath sounds: Normal breath sounds.  Abdominal:     Palpations: Abdomen is soft.     Tenderness: There is no abdominal tenderness. There is no right CVA tenderness or left CVA tenderness.  Skin:    General: Skin is warm and dry.     Findings: No erythema or rash.  Neurological:     Mental Status: She is alert and oriented to person, place, and time.  Psychiatric:        Behavior: Behavior normal.    ED Results / Procedures / Treatments   Labs (all labs ordered are listed, but only abnormal  results are displayed) Labs Reviewed  URINALYSIS, ROUTINE W REFLEX MICROSCOPIC - Abnormal; Notable for the following components:      Result Value   APPearance HAZY (*)    Hgb urine dipstick MODERATE (*)    Ketones, ur 5 (*)    Nitrite POSITIVE (*)    Bacteria, UA RARE (*)    All other components within normal limits  PREGNANCY, URINE    EKG None  Radiology No results found.  Procedures Procedures    Medications Ordered in ED Medications - No data to display  ED Course/ Medical Decision Making/ A&P                           Medical Decision Making Amount and/or Complexity of Data Reviewed Labs: ordered.  Risk Prescription drug management.   41 year old  female with complaint of low back pain, headache with body urine and foul odor for 1 week.  On exam, abdomen soft nontender, no CVA tenderness, no pain to palpation of the back.  Urinalysis suggest UTI.  Will treat with Keflex.  Patient is not pregnant.  Recommend recheck with PCP if symptoms or not improving, return to ED for worsening of current symptoms.  No recent records on file for review.        Final Clinical Impression(s) / ED Diagnoses Final diagnoses:  Urinary tract infection in female    Rx / DC Orders ED Discharge Orders          Ordered    cephALEXin (KEFLEX) 500 MG capsule  2 times daily        03/05/21 1807              Tacy Learn, PA-C 03/05/21 1813    Noemi Chapel, MD 03/05/21 2244

## 2021-03-05 NOTE — Discharge Instructions (Addendum)
Take antibiotics as prescribed and complete the full course. Follow-up with your doctor if not improving, return to the emergency room for worsening or concerning symptoms.

## 2021-03-05 NOTE — ED Triage Notes (Signed)
Bilateral flank pain x 1week.

## 2023-01-26 LAB — AMB RESULTS CONSOLE CBG: Glucose: 85
